# Patient Record
Sex: Male | Born: 1995 | Race: Black or African American | Hispanic: No | Marital: Single | Smoking: Current every day smoker
Health system: Southern US, Community
[De-identification: ages and names within clinical notes are randomized; demographics above are authoritative.]

## PROBLEM LIST (undated history)

## (undated) ENCOUNTER — Emergency Department (HOSPITAL_BASED_OUTPATIENT_CLINIC_OR_DEPARTMENT_OTHER): Admission: EM | Payer: Self-pay | Source: Home / Self Care

---

## 2014-10-30 ENCOUNTER — Emergency Department (HOSPITAL_COMMUNITY)
Admission: EM | Admit: 2014-10-30 | Discharge: 2014-10-31 | Disposition: A | Discharge status: Home / Self Care | Attending: Emergency Medicine | Admitting: Emergency Medicine

## 2014-10-30 ENCOUNTER — Encounter (HOSPITAL_COMMUNITY): Payer: Self-pay | Admitting: Adult Health

## 2014-10-30 ENCOUNTER — Emergency Department (HOSPITAL_COMMUNITY)

## 2014-10-30 DIAGNOSIS — B349 Viral infection, unspecified: Secondary | ICD-10-CM

## 2014-10-30 DIAGNOSIS — R079 Chest pain, unspecified: Secondary | ICD-10-CM | POA: Insufficient documentation

## 2014-10-30 DIAGNOSIS — R509 Fever, unspecified: Secondary | ICD-10-CM | POA: Diagnosis present

## 2014-10-30 LAB — I-STAT CG4 LACTIC ACID, ED: LACTIC ACID, VENOUS: 1.21 mmol/L (ref 0.5–2.0)

## 2014-10-30 LAB — CBC WITH DIFFERENTIAL/PLATELET
Basophils Absolute: 0 10*3/uL (ref 0.0–0.1)
Basophils Relative: 0 % (ref 0–1)
EOS ABS: 0 10*3/uL (ref 0.0–0.7)
Eosinophils Relative: 0 % (ref 0–5)
HCT: 44.5 % (ref 39.0–52.0)
HEMOGLOBIN: 15.5 g/dL (ref 13.0–17.0)
LYMPHS ABS: 0.5 10*3/uL — AB (ref 0.7–4.0)
LYMPHS PCT: 6 % — AB (ref 12–46)
MCH: 30 pg (ref 26.0–34.0)
MCHC: 34.8 g/dL (ref 30.0–36.0)
MCV: 86.1 fL (ref 78.0–100.0)
MONOS PCT: 10 % (ref 3–12)
Monocytes Absolute: 0.9 10*3/uL (ref 0.1–1.0)
NEUTROS PCT: 84 % — AB (ref 43–77)
Neutro Abs: 7.5 10*3/uL (ref 1.7–7.7)
PLATELETS: 157 10*3/uL (ref 150–400)
RBC: 5.17 MIL/uL (ref 4.22–5.81)
RDW: 12.6 % (ref 11.5–15.5)
WBC: 9 10*3/uL (ref 4.0–10.5)

## 2014-10-30 LAB — RAPID STREP SCREEN (MED CTR MEBANE ONLY): Streptococcus, Group A Screen (Direct): NEGATIVE

## 2014-10-30 MED ORDER — SODIUM CHLORIDE 0.9 % IV BOLUS (SEPSIS)
1000.0000 mL | Freq: Once | INTRAVENOUS | Status: AC
  Administered 2014-10-30: 1000 mL via INTRAVENOUS

## 2014-10-30 MED ORDER — KETOROLAC TROMETHAMINE 30 MG/ML IJ SOLN
30.0000 mg | Freq: Once | INTRAMUSCULAR | Status: AC
  Administered 2014-10-30: 30 mg via INTRAVENOUS
  Filled 2014-10-30: qty 1

## 2014-10-30 MED ORDER — ACETAMINOPHEN 325 MG PO TABS
650.0000 mg | ORAL_TABLET | Freq: Once | ORAL | Status: AC
  Administered 2014-10-30: 650 mg via ORAL
  Filled 2014-10-30: qty 2

## 2014-10-30 NOTE — ED Provider Notes (Signed)
CSN: [id number]     Arrival date & time 10/30/14  2054 History   First MD Initiated Contact with Patient 10/30/14 2240     Chief Complaint  Patient presents with  . Fever   HPI   Patient is 19 year old male with no past medical history who presents emergency room for evaluation of fever, chills, sore throat, and myalgias. Patient states that approximately 6:30 this afternoon he woke up from a nap and had hot and cold chills, had some mild sweating, some sore throat, nasal congestion, runny nose, and myalgias. He is also complaining of some chest pain. He denies coughing. He denies any sick contacts. He did not try any medications at home prior to coming here. Nothing seems to make him feel any worse. Patient has not had a flu shot this year. Patient is seen at [facility].  History reviewed. No pertinent past medical history. No past surgical history on file. History reviewed. No pertinent family history. History  Substance Use Topics  . Smoking status: Not on file  . Smokeless tobacco: Not on file  . Alcohol Use: Not on file    Review of Systems  Constitutional: Positive for fever, chills, diaphoresis and fatigue.  HENT: Positive for congestion, rhinorrhea and sore throat.   Respiratory: Negative for chest tightness, shortness of breath and wheezing.   Cardiovascular: Positive for chest pain. Negative for palpitations and leg swelling.  Gastrointestinal: Negative for nausea, vomiting, abdominal pain, diarrhea and constipation.  Genitourinary: Negative for dysuria, urgency, frequency, hematuria and difficulty urinating.  Musculoskeletal: Positive for myalgias. Negative for neck pain and neck stiffness.  Neurological: Negative for headaches.  All other systems reviewed and are negative.     Allergies  Review of patient's allergies indicates no known allergies.  Home Medications   Prior to Admission medications   Medication Sig Start Date End Date Taking? Authorizing  Provider  ibuprofen (ADVIL,MOTRIN) 800 MG tablet Take 1 tablet (800 mg total) by mouth 3 (three) times daily. 10/31/14   Kaleigha Chamberlin A Forcucci, PA-C  oseltamivir (TAMIFLU) 75 MG capsule Take 1 capsule (75 mg total) by mouth every 12 (twelve) hours. 10/31/14   Collie Wernick A Forcucci, PA-C   BP 122/51 mmHg  Pulse 102  Temp(Src) 100.8 F (38.2 C) (Oral)  Resp 20  SpO2 99% Physical Exam  Constitutional: He is oriented to person, place, and time. He appears well-developed and well-nourished. No distress.  HENT:  Head: Normocephalic and atraumatic.  Nose: Nose normal.  Mouth/Throat: Uvula is midline and mucous membranes are normal. No trismus in the jaw. Posterior oropharyngeal edema and posterior oropharyngeal erythema present. No oropharyngeal exudate or tonsillar abscesses.  Eyes: Conjunctivae and EOM are normal. Pupils are equal, round, and reactive to light. No scleral icterus.  Neck: Normal range of motion. Neck supple. No JVD present. No Brudzinski's sign and no Kernig's sign noted. No thyromegaly present.  Cardiovascular: Normal rate, regular rhythm, normal heart sounds and intact distal pulses.  Exam reveals no gallop and no friction rub.   No murmur heard. Pulmonary/Chest: Effort normal. No respiratory distress. He has no wheezes. He has rhonchi in the right lower field. He has no rales. He exhibits no tenderness.  Abdominal: Soft. Bowel sounds are normal. He exhibits no distension and no mass. There is no tenderness. There is no rebound and no guarding.  Musculoskeletal: Normal range of motion.  Lymphadenopathy:    He has no cervical adenopathy.  Neurological: He is alert and oriented to person, place, and  time. He has normal strength. No cranial nerve deficit or sensory deficit. Coordination normal.  Skin: Skin is warm and dry. He is not diaphoretic.  Psychiatric: He has a normal mood and affect. His behavior is normal. Judgment and thought content normal.  Nursing note and vitals  reviewed.   ED Course  Procedures (including critical care time) Labs Review Labs Reviewed  CBC WITH DIFFERENTIAL/PLATELET - Abnormal; Notable for the following:    Neutrophils Relative % 84 (*)    Lymphocytes Relative 6 (*)    Lymphs Abs 0.5 (*)    All other components within normal limits  COMPREHENSIVE METABOLIC PANEL - Abnormal; Notable for the following:    Potassium 3.4 (*)    All other components within normal limits  RAPID STREP SCREEN  CULTURE, GROUP A STREP  I-STAT CG4 LACTIC ACID, ED    Imaging Review Dg Chest 2 View  10/30/2014   CLINICAL DATA:  Fever, chills and chest pain today  EXAM: CHEST  2 VIEW  COMPARISON:  None  FINDINGS: Upper normal heart size.  Normal mediastinal contours and pulmonary vascularity.  Lungs clear.  No pleural effusion or pneumothorax.  Bones unremarkable.  IMPRESSION: No acute abnormalities.   Electronically Signed   By: Ulyses SouthwardMark  Boles M.D.   On: 10/30/2014 21:35     EKG Interpretation None      MDM   Final diagnoses:  Viral syndrome   Patient's 19 year old male who presents emergency room for evaluation of sore throat, fever, myalgias, and congestion. Physical exam does reveal some erythema of the oropharynx and no exudate. Exam is otherwise unremarkable. Rapid strep is negative. Chest x-ray is negative for acute abnormalities. CBC, CMP, and i-STAT lactic acid are currently pending. We'll start a normal saline bolus and will give an injection of Toradol. Patient already given 650 mg of Tylenol on arrival at the ED with some relief of fever. Suspect that this is likely viral upper respiratory infection. Negative Kernig and Brudzinski sign. No concerns for meningismus at this time. CBC and CMP are negative. I-STAT lactic acid is negative. Suspect that this is viral syndrome. We'll discharge patient home with ibuprofen 800 mg, and will have him alternate Tylenol and ibuprofen as needed for myalgias. I recommended drinking lots of fluids, rest, and  frequent hand washing to prevent spread of illness. We'll also discharge home with Tamiflu. Patient to return for intractable fevers, worsening shortness of breath, productive cough, or any other concerning symptoms. Family and patient state understanding and agreement at this time. Patient stable for discharge.   Eben Burowourtney A Forcucci, PA-C 10/31/14 0020  Geoffery Lyonsouglas Delo, MD 11/03/14 [phone]

## 2014-10-30 NOTE — ED Notes (Addendum)
Presents with body aches, sore throat, fever and runny nose began today around 3 pm. Temp of 101.2 denies nausea, vomting and diarrhea. VSS. Throat red. Endorses headache and body aches. Denies neck pain.

## 2014-10-31 LAB — COMPREHENSIVE METABOLIC PANEL
ALK PHOS: 49 U/L (ref 39–117)
ALT: 22 U/L (ref 0–53)
AST: 25 U/L (ref 0–37)
Albumin: 4.6 g/dL (ref 3.5–5.2)
Anion gap: 7 (ref 5–15)
BILIRUBIN TOTAL: 0.6 mg/dL (ref 0.3–1.2)
BUN: 6 mg/dL (ref 6–23)
CALCIUM: 9.5 mg/dL (ref 8.4–10.5)
CO2: 25 mmol/L (ref 19–32)
Chloride: 105 mmol/L (ref 96–112)
Creatinine, Ser: 0.98 mg/dL (ref 0.50–1.35)
GFR calc Af Amer: >90 mL/min (ref >90)
Glucose, Bld: 89 mg/dL (ref 70–99)
Potassium: 3.4 mmol/L — ABNORMAL LOW (ref 3.5–5.1)
Sodium: 137 mmol/L (ref 135–145)
TOTAL PROTEIN: 7.2 g/dL (ref 6.0–8.3)

## 2014-10-31 MED ORDER — OSELTAMIVIR PHOSPHATE 75 MG PO CAPS: 75.0000 mg | ORAL_CAPSULE | Freq: Two times a day (BID) | ORAL | Status: AC

## 2014-10-31 MED ORDER — IBUPROFEN 800 MG PO TABS: 800.0000 mg | ORAL_TABLET | Freq: Three times a day (TID) | ORAL | Status: AC

## 2014-10-31 NOTE — Discharge Instructions (Signed)
Viral Infections °A viral infection can be caused by different types of viruses. Most viral infections are not serious and resolve on their own. However, some infections may cause severe symptoms and may lead to further complications. °SYMPTOMS °Viruses can frequently cause: °· Minor sore throat. °· Aches and pains. °· Headaches. °· Runny nose. °· Different types of rashes. °· Watery eyes. °· Tiredness. °· Cough. °· Loss of appetite. °· Gastrointestinal infections, resulting in nausea, vomiting, and diarrhea. °These symptoms do not respond to antibiotics because the infection is not caused by bacteria. However, you might catch a bacterial infection following the viral infection. This is sometimes called a "superinfection." Symptoms of such a bacterial infection may include: °· Worsening sore throat with pus and difficulty swallowing. °· Swollen neck glands. °· Chills and a high or persistent fever. °· Severe headache. °· Tenderness over the sinuses. °· Persistent overall ill feeling (malaise), muscle aches, and tiredness (fatigue). °· Persistent cough. °· Yellow, green, or brown mucus production with coughing. °HOME CARE INSTRUCTIONS  °· Only take over-the-counter or prescription medicines for pain, discomfort, diarrhea, or fever as directed by your caregiver. °· Drink enough water and fluids to keep your urine clear or pale yellow. Sports drinks can provide valuable electrolytes, sugars, and hydration. °· Get plenty of rest and maintain proper nutrition. Soups and broths with crackers or rice are fine. °SEEK IMMEDIATE MEDICAL CARE IF:  °· You have severe headaches, shortness of breath, chest pain, neck pain, or an unusual rash. °· You have uncontrolled vomiting, diarrhea, or you are unable to keep down fluids. °· You or your child has an oral temperature above 102° F (38.9° C), not controlled by medicine. °· Your baby is older than 3 months with a rectal temperature of 102° F (38.9° C) or higher. °· Your baby is 3  months old or younger with a rectal temperature of 100.4° F (38° C) or higher. °MAKE SURE YOU:  °· Understand these instructions. °· Will watch your condition. °· Will get help right away if you are not doing well or get worse. °Document Released: 06/10/2005 Document Revised: 11/23/2011 Document Reviewed: 01/05/2011 °ExitCare® Patient Information ©2015 [employer]. This information is not intended to replace advice given to you by your health care provider. Make sure you discuss any questions you have with your health care provider. ° ° °Emergency Department Resource Guide °1) Find a Doctor and Pay Out of Pocket °Although you won't have to find out who is covered by your insurance plan, it is a good idea to ask around and get recommendations. You will then need to call the office and see if the doctor you have chosen will accept you as a new patient and what types of options they offer for patients who are self-pay. Some doctors offer discounts or will set up payment plans for their patients who do not have insurance, but you will need to ask so you aren't surprised when you get to your appointment. ° °2) Contact Your Local Health Department °Not all health departments have doctors that can see patients for sick visits, but many do, so it is worth a call to see if yours does. If you don't know where your local health department is, you can check in your phone book. The CDC also has a tool to help you locate your state's health department, and many state websites also have listings of all of their local health departments. ° °3) Find a Walk-in Clinic °If your illness is not likely   to be very severe or complicated, you may want to try a walk in clinic. These are popping up all over the country in pharmacies, drugstores, and shopping centers. They're usually staffed by nurse practitioners or physician assistants that have been trained to treat common illnesses and complaints. They're usually fairly quick and  inexpensive. However, if you have serious medical issues or chronic medical problems, these are probably not your best option. ° °No Primary Care Doctor: °- Call Health Connect at  832-8000 - they can help you locate a primary care doctor that  accepts your insurance, provides certain services, etc. °- Physician Referral Service- 1-800-533-3463 ° °Chronic Pain Problems: °Organization         Address  Phone   Notes  °[facility]  (336) 297-2271 Patients need to be referred by their primary care doctor.  ° °Medication Assistance: °Organization         Address  Phone   Notes  °[place] [facility] [address] °[place], [place] [address] (336) 641-8030 --Must be a resident of [place] °-- Must have NO insurance coverage whatsoever (no Medicaid/ Medicare, etc.) °-- The pt. MUST have a primary care doctor that directs their care regularly and follows them in the community °  °[facility]  (866) 331-1348   °[employer]  (888) 892-1162   ° °Agencies that provide inexpensive medical care: °Organization         Address  Phone   Notes  °[facility]  (336) 832-8035   °[facility]    (336) 832-7272   °[facility] [address] °[place], [place] [address] (336) 832-4777   °[facility] [address], °[place] (336) 271-4999   °[facility]    (336) 373-0678   °[facility]    (336) 272-1050   °[facility] ° [address], [place] Phone:  (336) 832-4444, Fax:  (336) 832-4440 Hours of Operation:  9 am - 6 pm, M-F.  Also accepts Medicaid/Medicare and self-pay.  °[facility] ° [address], [place] Phone: (336) [phone], Fax: (336) [phone]. Hours of Operation:  8:30 am - 5:30 pm, M-F.  Also accepts Medicaid and self-pay.  °[facility] [place] [address], [place] Phone: (336) 878-6027   °[facility] [address], [place], [place] (336)723-1848, Ext. 123 Mondays & Thursdays: 7-9 AM.  First 15 patients are seen on a first come, first serve basis. °  ° °Medicaid-accepting [place] Providers: ° °Organization         Address  Phone   Notes  °[facility] [address], [place] (336) 641-2100 Also accepts self-pay patients.  °[facility] [address], [place] ° (336) 856-9996   °[facility] [address], [place] (336) 288-8857   °[facility] [address] [place] [address], [place] (336) 299-7000   °Veita Bland [address], [place]  ° (336) 373-1557 Only accepts [place] Access Medicaid patients after they have their name applied to their card.  ° °Self-Pay (no insurance) in [place]: ° °Organization         Address  Phone   Notes  °Sickle Cell Patients, [facility] [address], [place] (336) 832-1970   °[facility] [address], [place] (336) 832-4400   °[facility] ° [address], [place] (336) 992-4800   °[facility]/Dr. Osei-Bonsu ° [address] [place] [address], [place] or [address], [place] (336) 841-8500 Phone number for both [place] and [place] locations is the same.  °[facility] [address], [place] (336) 299-0000   °[facility] [address] [place] [address], [place] or [address] (336) 852-7530 °(336) 878-2260   °[facility] [address], [place] (336) 350-1642, phone; (336) 294-5005, fax Sees patients 1st and 3rd Saturday of every month.  Must not qualify for public or private insurance (i.e. Medicaid, Medicare, [place] Health Choice, Veterans' Benefits) • Household income should be no more than 200% of the poverty level •The clinic cannot treat you if you are pregnant or  think you are pregnant • Sexually transmitted diseases are not treated at the clinic.  ° ° °Dental Care: °Organization         Address  Phone  Notes  °[facility] [address], [place] (336) 641-6152 Accepts children up to age 21 who are enrolled in Medicaid or [place] Health Choice; pregnant women with a Medicaid card; and children who have applied for Medicaid or [place] Health Choice, but were declined, whose parents can pay a reduced fee at time of service.  °[facility] [place]  [address], [place] (336) 641-7733 Accepts children up to age 21 who are enrolled in Medicaid or [place] Health Choice; pregnant women with a Medicaid card; and children who have applied for Medicaid or [place] Health Choice, but were declined, whose parents can pay a reduced fee at time of service.  °[facility] ° [address], [place] (336) 641-4533 Patients are seen by appointment only. Walk-ins are not accepted. [facility] will see patients 18 years of age and older. °Monday - Tuesday (8am-5pm) °Most Wednesdays (8:30-5pm) °$30 per visit, cash only  °[facility] ° [address], [place] (336) 641-4533 Patients are seen by appointment only. Walk-ins are not accepted. [facility] will see patients 18 years of age and older. °One Wednesday Evening (Monthly: Volunteer Based).  $30 per visit, cash only  °[facility]  (919) 537-3737 for adults; Children under age 4, call Graduate Pediatric Dentistry at ([phone]) [phone]. Children aged 4-14, please call (919) [phone] to request a pediatric application. ° Dental services are provided in all areas of dental care including fillings, crowns and bridges, complete and partial dentures, implants, gum treatment, root canals, and extractions. Preventive care is also provided. Treatment is provided to both adults  and children. °Patients are selected via a lottery and there is often a waiting list. °  °[facility] [address], °[place] ° (336) 763-8833 [email or web address] °  °[facility] [address], [place], [place] (336)723-1848, Ext. 123 Second and Fourth Thursday of each month, opens at 6:30 AM; Clinic ends at 9 AM.  Patients are seen on a first-come first-served basis, and a limited number are seen during each clinic.  ° °[facility] ° [address], [place], [place] (336) 723-7904   Eligibility Requirements °You must have lived in [place], [place], or [place] counties for at least the last three months. °    You cannot be eligible for state or federal sponsored healthcare insurance, including Veterans Administration, Medicaid, or Medicare. °  You generally cannot be eligible for healthcare insurance through your employer.  °  How to apply: °Eligibility screenings are held every Tuesday and Wednesday afternoon from 1:00 pm until 4:00 pm. You do not need an appointment for the interview!  °[facility] [address], [place], [place] 336-631-2330   °[facility]  336-342-8273   °[facility]  336-703-3100   °[facility]  336-570-6415   ° °Behavioral Health Resources in the Community: °Intensive Outpatient Programs °Organization         Address  Phone  Notes  °[facility] [address], [place], [place] 336-878-6098   °[facility] [address], [place], [place] 336-832-9800   °[facility] [address], [place], [place] ° 336-882-2125   °[facility] [address],  °[place], [place] 1-800-853-5163 or 336-641-4981   °Substance Abuse Resources °Organization         Address  Phone  Notes  °[facility]  336-882-2125   °[facility]  336-784-9470   °[facility]  336-285-9073     °[facility]  336-845-3988   °Residential & Outpatient Substance Abuse Program  1-800-659-3381   °Psychological Services °Organization         Address  Phone  Notes  °[facility]  336- 832-9600   °[facility]  336- 378-7881   °[facility] [address], [place] 1-800-853-5163 or 336-641-4981   ° °Mobile Crisis Teams °Organization         Address  Phone  Notes  °[facility], Mobile Crisis Care Unit  1-877-626-1772   °Assertive °[facility] ° [address] [place], [place] 336-834-9664   °Sharon DeEsch [address] °[place] 336-554-5454   ° °Self-Help/Support Groups °Organization         Address  Phone             Notes  °[facility] - variety of support groups  336- 373-1402 Call for more information  °Narcotics Anonymous (NA), [facility] [address], °[place]  2 meetings at this location  ° °Residential Treatment Programs °Organization         Address  Phone  Notes  °[facility] [address],    °[place]  1-866-801-8205   °[facility] ° [address], [place], [place] 704-293-8524   °[facility] [address], [place] 336-845-3988 Admissions: 8am-3pm M-F  °[facility] [address]    °[place], [place] 336-841-1104   °[facility] [address], [place], [place] 336-379-7146   °[facility] [address]  °[place], [place] 336-285-9073   °[facility] Intensive Outpatient [address], [place], [place] 336-852-3033   °[facility] [address]  °[place], [place] 1-877-615-2722 or 336-784-9470   °[facility] [address] [place], [place] 336-227-7417 Accepts Medicaid  °[facility] [address]  °[place] 1-800-659-3381 Substance Abuse/Addiction Treatment  ° °[place]  Behavioral Health Resources °Organization         Address  Phone  Notes  °[facility]  (888) 581-9988   °Julie Brannon, PhD [address] [place], [place]   (336) 349-5553 or (336) 951-0000   °[facility]   [address] °[place], [place] (336) 349-4454   °[facility] [address], [place], [place] (336) 342-8316 Insurance/Medicaid/sponsorship through [facility]  °[facility] [address]                                    [place], [place] (336) 342-8316 Therapy/tele-psych/case  °[facility] [address]  ° [place], [place] (336) 349-2233    °Dr. Arfeen  (336) 349-4544   °[facility] [place]  [employer] [place] [facility] 1) [address],  °2) [address], [place] °3)  [address], [place] (336) 349-3220 °(336) 342-7768 ° °(336) 342-8140   °[place] [facility] (336) 342-1394 or (336) 342-3537 (After Hours)    ° ° ° °

## 2014-11-01 LAB — CULTURE, GROUP A STREP

## 2017-06-07 ENCOUNTER — Emergency Department (HOSPITAL_COMMUNITY): Payer: Self-pay

## 2017-06-07 ENCOUNTER — Emergency Department (HOSPITAL_COMMUNITY)
Admission: EM | Admit: 2017-06-07 | Discharge: 2017-06-07 | Disposition: A | Discharge status: Home / Self Care | Payer: Self-pay | Attending: Emergency Medicine | Admitting: Emergency Medicine

## 2017-06-07 ENCOUNTER — Encounter (HOSPITAL_COMMUNITY): Payer: Self-pay

## 2017-06-07 DIAGNOSIS — F17200 Nicotine dependence, unspecified, uncomplicated: Secondary | ICD-10-CM | POA: Insufficient documentation

## 2017-06-07 DIAGNOSIS — J069 Acute upper respiratory infection, unspecified: Secondary | ICD-10-CM

## 2017-06-07 DIAGNOSIS — J399 Disease of upper respiratory tract, unspecified: Secondary | ICD-10-CM | POA: Insufficient documentation

## 2017-06-07 DIAGNOSIS — B9789 Other viral agents as the cause of diseases classified elsewhere: Secondary | ICD-10-CM

## 2017-06-07 DIAGNOSIS — R0789 Other chest pain: Secondary | ICD-10-CM | POA: Insufficient documentation

## 2017-06-07 LAB — I-STAT CHEM 8, ED
BUN: 13 mg/dL (ref 6–20)
Calcium, Ion: 1.16 mmol/L (ref 1.15–1.40)
Chloride: 103 mmol/L (ref 101–111)
Creatinine, Ser: 1.3 mg/dL — ABNORMAL HIGH (ref 0.61–1.24)
GLUCOSE: 99 mg/dL (ref 65–99)
HCT: 48 % (ref 39.0–52.0)
HEMOGLOBIN: 16.3 g/dL (ref 13.0–17.0)
POTASSIUM: 3.7 mmol/L (ref 3.5–5.1)
Sodium: 142 mmol/L (ref 135–145)
TCO2: 28 mmol/L (ref 22–32)

## 2017-06-07 LAB — I-STAT TROPONIN, ED: TROPONIN I, POC: 0.01 ng/mL (ref 0.00–0.08)

## 2017-06-07 MED ORDER — IBUPROFEN 400 MG PO TABS
600.0000 mg | ORAL_TABLET | Freq: Once | ORAL | Status: AC
  Administered 2017-06-07: 600 mg via ORAL
  Filled 2017-06-07: qty 1

## 2017-06-07 NOTE — ED Triage Notes (Signed)
Pt states he has been having sore throat for a couple of days with chest pain when he coughs; Pt states chest pain is only when he coughs; Pt states pain in throat is 6/10 on arrival. Pt states he a current every day smoker;Pt a&ox 4 on arrival.

## 2017-06-07 NOTE — ED Provider Notes (Signed)
[facility]-EMERGENCY DEPT Provider Note   CSN: [id number] Arrival date & time: 06/07/17  1909     History   Chief Complaint Chief Complaint  Patient presents with  . Sore Throat  . Chest Pain    HPI Jay Black is a 21 y.o. male.  HPI  21 year old male with no significant past medical history presents with chest pain with coughing, cough, and sore throat. He denies fevers or shortness of breath. This all started over the last 2 days. Started with sore throat first and then has had cough and pain with coughing. The pain is in his middle anterior chest. No abdominal pain, back pain, vomiting. No leg swelling or recent travel. Has not taken anything for the symptoms.  History reviewed. No pertinent past medical history.  There are no active problems to display for this patient.   History reviewed. No pertinent surgical history.     Home Medications    Prior to Admission medications   Medication Sig Start Date End Date Taking? Authorizing Provider  ibuprofen (ADVIL,MOTRIN) 800 MG tablet Take 1 tablet (800 mg total) by mouth 3 (three) times daily. 10/31/14   Forcucci, Courtney, PA-C  oseltamivir (TAMIFLU) 75 MG capsule Take 1 capsule (75 mg total) by mouth every 12 (twelve) hours. 10/31/14   Forcucci, Toni Amend, PA-C    Family History No family history on file.  Social History Social History  Substance Use Topics  . Smoking status: Current Every Day Smoker  . Smokeless tobacco: Not on file  . Alcohol use Yes     Allergies   Patient has no known allergies.   Review of Systems Review of Systems  Constitutional: Negative for fever.  HENT: Positive for sore throat.   Respiratory: Positive for cough. Negative for shortness of breath.   Cardiovascular: Positive for chest pain. Negative for leg swelling.  Neurological: Negative for headaches.  All other systems reviewed and are negative.    Physical Exam Updated Vital Signs BP (!) 152/81 (BP Location: Right Arm)    Pulse 88   Temp 98.6 F (37 C) (Oral)   Resp 16   Ht  (1.676 m)   Wt 74.6 kg (164 lb 8 oz)   SpO2 99%   BMI 26.55 kg/m   Physical Exam  Constitutional: He is oriented to person, place, and time. He appears well-developed and well-nourished.  HENT:  Head: Normocephalic and atraumatic.  Right Ear: External ear normal.  Left Ear: External ear normal.  Nose: Nose normal.  Mouth/Throat: Oropharynx is clear and moist. No oropharyngeal exudate.  Eyes: Right eye exhibits no discharge. Left eye exhibits no discharge.  Neck: Neck supple.  Cardiovascular: Normal rate, regular rhythm and normal heart sounds.   Pulmonary/Chest: Effort normal and breath sounds normal. He exhibits no tenderness.  Abdominal: Soft. There is no tenderness.  Musculoskeletal: He exhibits no edema.  Neurological: He is alert and oriented to person, place, and time.  Skin: Skin is warm and dry.  Nursing note and vitals reviewed.    ED Treatments / Results  Labs (all labs ordered are listed, but only abnormal results are displayed) Labs Reviewed  I-STAT CHEM 8, ED - Abnormal; Notable for the following:       Result Value   Creatinine, Ser 1.30 (*)    All other components within normal limits  I-STAT TROPONIN, ED    EKG  EKG Interpretation  Date/Time:  Monday June 07 2017 19:47:18 EDT Ventricular Rate:  98 PR Interval:  132  QRS Duration: 86 QT Interval:  332 QTC Calculation: 423 R Axis:   93 Text Interpretation:  Normal sinus rhythm Rightward axis T wave abnormality, consider inferior ischemia Abnormal ECG No old tracing to compare Confirmed by Pricilla Loveless [phone]) on 06/07/2017 7:56:38 PM       Radiology Dg Chest 2 View  Result Date: 06/07/2017 CLINICAL DATA:  Sore throat for several days EXAM: CHEST  2 VIEW COMPARISON:  None. FINDINGS: The heart size and mediastinal contours are within normal limits. Both lungs are clear. The visualized skeletal structures are unremarkable.  IMPRESSION: No active cardiopulmonary disease. Electronically Signed   By: Tollie Eth M.D.   On: 06/07/2017 20:36    Procedures Procedures (including critical care time)  Medications Ordered in ED Medications  ibuprofen (ADVIL,MOTRIN) tablet 600 mg (600 mg Oral Given 06/07/17 2032)     Initial Impression / Assessment and Plan / ED Course  I have reviewed the triage vital signs and the nursing notes.  Pertinent labs & imaging results that were available during my care of the patient were reviewed by me and considered in my medical decision making (see chart for details).     Likely has mild viral URI. While he does have chest pain with some nonspecific T-wave changes this is very likely to be chest wall in etiology. Given it is coming along with cough and sore throat I think viral URI is the most likely cause. He did have a troponin sent by triage which was negative. Given symptoms for over 48 hours at think ACS is highly unlikely. Highly doubt PE or dissection. Creatinine slightly increased from last check 2 years ago, will need to increase fluid intake but does not appear consistent with renal failure. Discussed return precautions.  Final Clinical Impressions(s) / ED Diagnoses   Final diagnoses:  Viral upper respiratory tract infection with cough  Chest wall pain    New Prescriptions Discharge Medication List as of 06/07/2017  8:43 PM       Pricilla Loveless, MD 06/07/17 2057

## 2017-11-09 ENCOUNTER — Emergency Department (HOSPITAL_COMMUNITY)
Admission: EM | Admit: 2017-11-09 | Discharge: 2017-11-09 | Disposition: A | Discharge status: Home / Self Care | Payer: Self-pay | Attending: Emergency Medicine | Admitting: Emergency Medicine

## 2017-11-09 ENCOUNTER — Other Ambulatory Visit: Payer: Self-pay

## 2017-11-09 ENCOUNTER — Encounter (HOSPITAL_COMMUNITY): Payer: Self-pay | Admitting: *Deleted

## 2017-11-09 DIAGNOSIS — Z79899 Other long term (current) drug therapy: Secondary | ICD-10-CM | POA: Insufficient documentation

## 2017-11-09 DIAGNOSIS — J069 Acute upper respiratory infection, unspecified: Secondary | ICD-10-CM | POA: Insufficient documentation

## 2017-11-09 DIAGNOSIS — F17200 Nicotine dependence, unspecified, uncomplicated: Secondary | ICD-10-CM | POA: Insufficient documentation

## 2017-11-09 MED ORDER — BENZONATATE 100 MG PO CAPS: 200.0000 mg | ORAL_CAPSULE | Freq: Two times a day (BID) | ORAL | 0 refills | Status: AC | PRN

## 2017-11-09 MED ORDER — CETIRIZINE HCL 10 MG PO TABS: 10.0000 mg | ORAL_TABLET | Freq: Every day | ORAL | 1 refills | Status: AC

## 2017-11-09 MED ORDER — ALBUTEROL SULFATE (2.5 MG/3ML) 0.083% IN NEBU
5.0000 mg | INHALATION_SOLUTION | Freq: Once | RESPIRATORY_TRACT | Status: AC
  Administered 2017-11-09: 5 mg via RESPIRATORY_TRACT
  Filled 2017-11-09: qty 6

## 2017-11-09 NOTE — ED Provider Notes (Signed)
[facility] EMERGENCY DEPARTMENT Provider Note   CSN: [id number] Arrival date & time: 11/09/17  0219     History   Chief Complaint Chief Complaint  Patient presents with  . Nasal Congestion    HPI Jay Black is a 22 y.o. male.  Patient presents to the emergency department with chief complaint of nasal congestion.  He denies any fevers or chills.  Denies nausea or vomiting.  Denies productive cough.  There are no other associated symptoms.  There are no aggravating factors.  There are no alleviating factors.   The history is provided by the patient. No language interpreter was used.    History reviewed. No pertinent past medical history.  There are no active problems to display for this patient.   History reviewed. No pertinent surgical history.     Home Medications    Prior to Admission medications   Medication Sig Start Date End Date Taking? Authorizing Provider  benzonatate (TESSALON) 100 MG capsule Take 2 capsules (200 mg total) by mouth 2 (two) times daily as needed for cough. 11/09/17   Roxy Horseman, PA-C  cetirizine (ZYRTEC ALLERGY) 10 MG tablet Take 1 tablet (10 mg total) by mouth daily. 11/09/17   Roxy Horseman, PA-C  ibuprofen (ADVIL,MOTRIN) 800 MG tablet Take 1 tablet (800 mg total) by mouth 3 (three) times daily. 10/31/14   Forcucci, Courtney, PA-C  oseltamivir (TAMIFLU) 75 MG capsule Take 1 capsule (75 mg total) by mouth every 12 (twelve) hours. 10/31/14   Forcucci, Toni Amend, PA-C    Family History No family history on file.  Social History Social History   Tobacco Use  . Smoking status: Current Every Day Smoker  . Smokeless tobacco: Never Used  Substance Use Topics  . Alcohol use: Yes  . Drug use: No     Allergies   Patient has no known allergies.   Review of Systems Review of Systems  All other systems reviewed and are negative.    Physical Exam Updated Vital Signs BP (!) 162/79 (BP Location: Right Arm)    Pulse 94   Temp 99.4 F (37.4 C) (Oral)   Resp 20   SpO2 100%   Physical Exam Physical Exam  Constitutional: Pt  is oriented to person, place, and time. Appears well-developed and well-nourished. No distress.  HENT:  Head: Normocephalic and atraumatic.  Right Ear: Tympanic membrane, external ear and ear canal normal.  Left Ear: Tympanic membrane, external ear and ear canal normal.  Nose: Mucosal edema and mild rhinorrhea present. No epistaxis. Right sinus exhibits no maxillary sinus tenderness and no frontal sinus tenderness. Left sinus exhibits no maxillary sinus tenderness and no frontal sinus tenderness.  Mouth/Throat: Uvula is midline and mucous membranes are normal. Mucous membranes are not pale and not cyanotic. No oropharyngeal exudate, posterior oropharyngeal edema, posterior oropharyngeal erythema or tonsillar abscesses.  Eyes: Conjunctivae are normal. Pupils are equal, round, and reactive to light.  Neck: Normal range of motion and full passive range of motion without pain.  Cardiovascular: Normal rate and intact distal pulses.   Pulmonary/Chest: Effort normal and breath sounds normal. No stridor.  Clear and equal breath sounds without focal wheezes, rhonchi, rales  Abdominal: Soft. Bowel sounds are normal. There is no tenderness.  Musculoskeletal: Normal range of motion.  Lymphadenopathy:    Pthas no cervical adenopathy.  Neurological: Pt is alert and oriented to person, place, and time.  Skin: Skin is warm and dry. No rash noted. Pt is not diaphoretic.  Psychiatric:  Normal mood and affect.  Nursing note and vitals reviewed.    ED Treatments / Results  Labs (all labs ordered are listed, but only abnormal results are displayed) Labs Reviewed - No data to display  EKG  EKG Interpretation None       Radiology No results found.  Procedures Procedures (including critical care time)  Medications Ordered in ED Medications  albuterol (PROVENTIL) (2.5 MG/3ML)  0.083% nebulizer solution 5 mg (5 mg Nebulization Given 11/09/17 0238)     Initial Impression / Assessment and Plan / ED Course  I have reviewed the triage vital signs and the nursing notes.  Pertinent labs & imaging results that were available during my care of the patient were reviewed by me and considered in my medical decision making (see chart for details).     Pt CXR negative for acute infiltrate. Patients symptoms are consistent with URI, likely viral etiology. Discussed that antibiotics are not indicated for viral infections. Pt will be discharged with symptomatic treatment.  Verbalizes understanding and is agreeable with plan. Pt is hemodynamically stable & in NAD prior to dc.   Final Clinical Impressions(s) / ED Diagnoses   Final diagnoses:  Upper respiratory tract infection, unspecified type    ED Discharge Orders        Ordered    cetirizine (ZYRTEC ALLERGY) 10 MG tablet  Daily     11/09/17 0538    benzonatate (TESSALON) 100 MG capsule  2 times daily PRN     11/09/17 0538       Roxy HorsemanBrowning, Jame Morrell, PA-C 11/09/17 0540    Glynn Octaveancour, Stephen, MD 11/09/17 [phone]

## 2017-11-09 NOTE — ED Triage Notes (Signed)
Pt has been feeling sick since last night; reports having increased dry cough tonight with difficulty catching his breath and chest tightness

## 2018-09-04 IMAGING — DX DG CHEST 2V
2 series · 2 of 2 positions shown · non-contrast
Comparison: None.

CLINICAL DATA: Sore throat for several days

EXAM:
CHEST  2 VIEW

[chest pa]
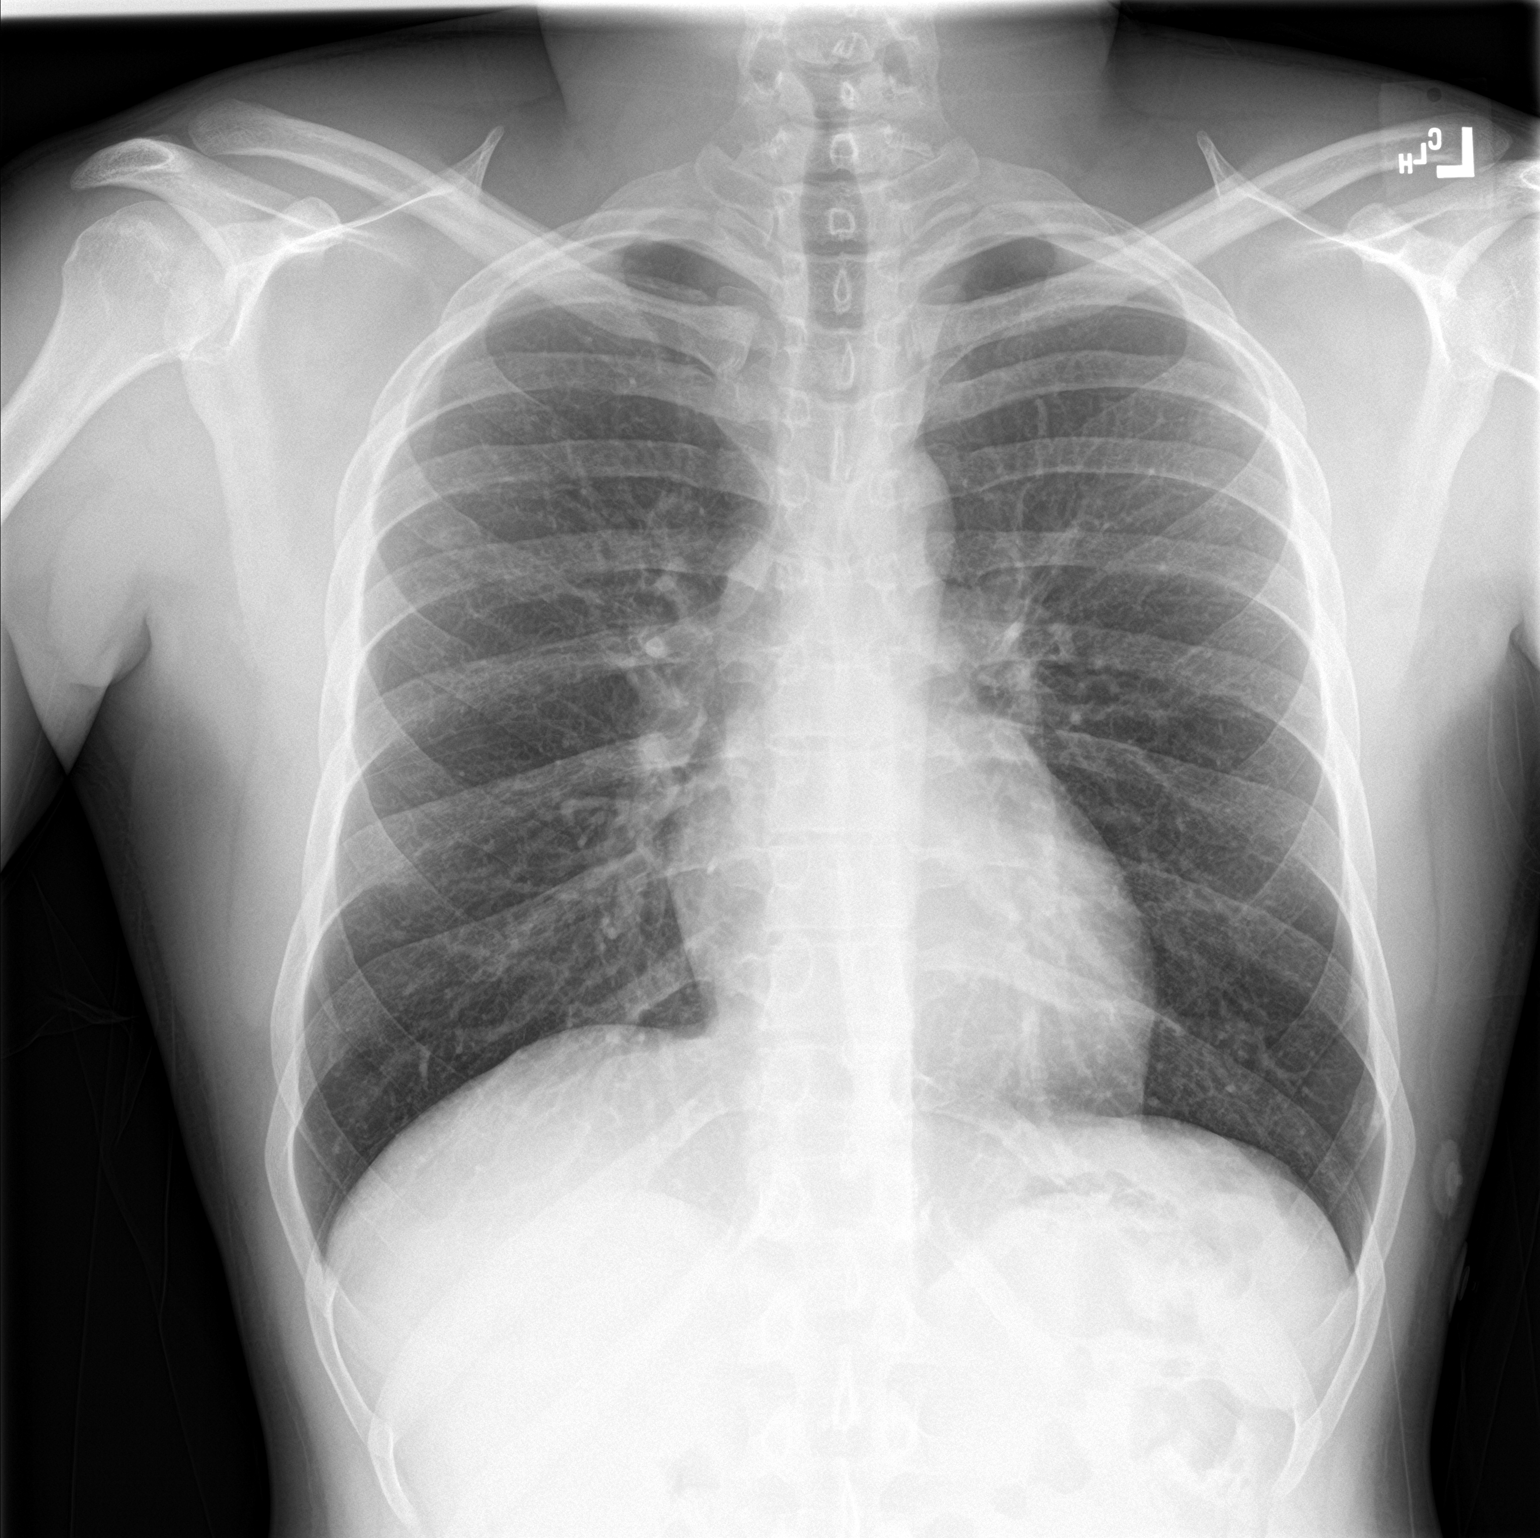

[chest lat]
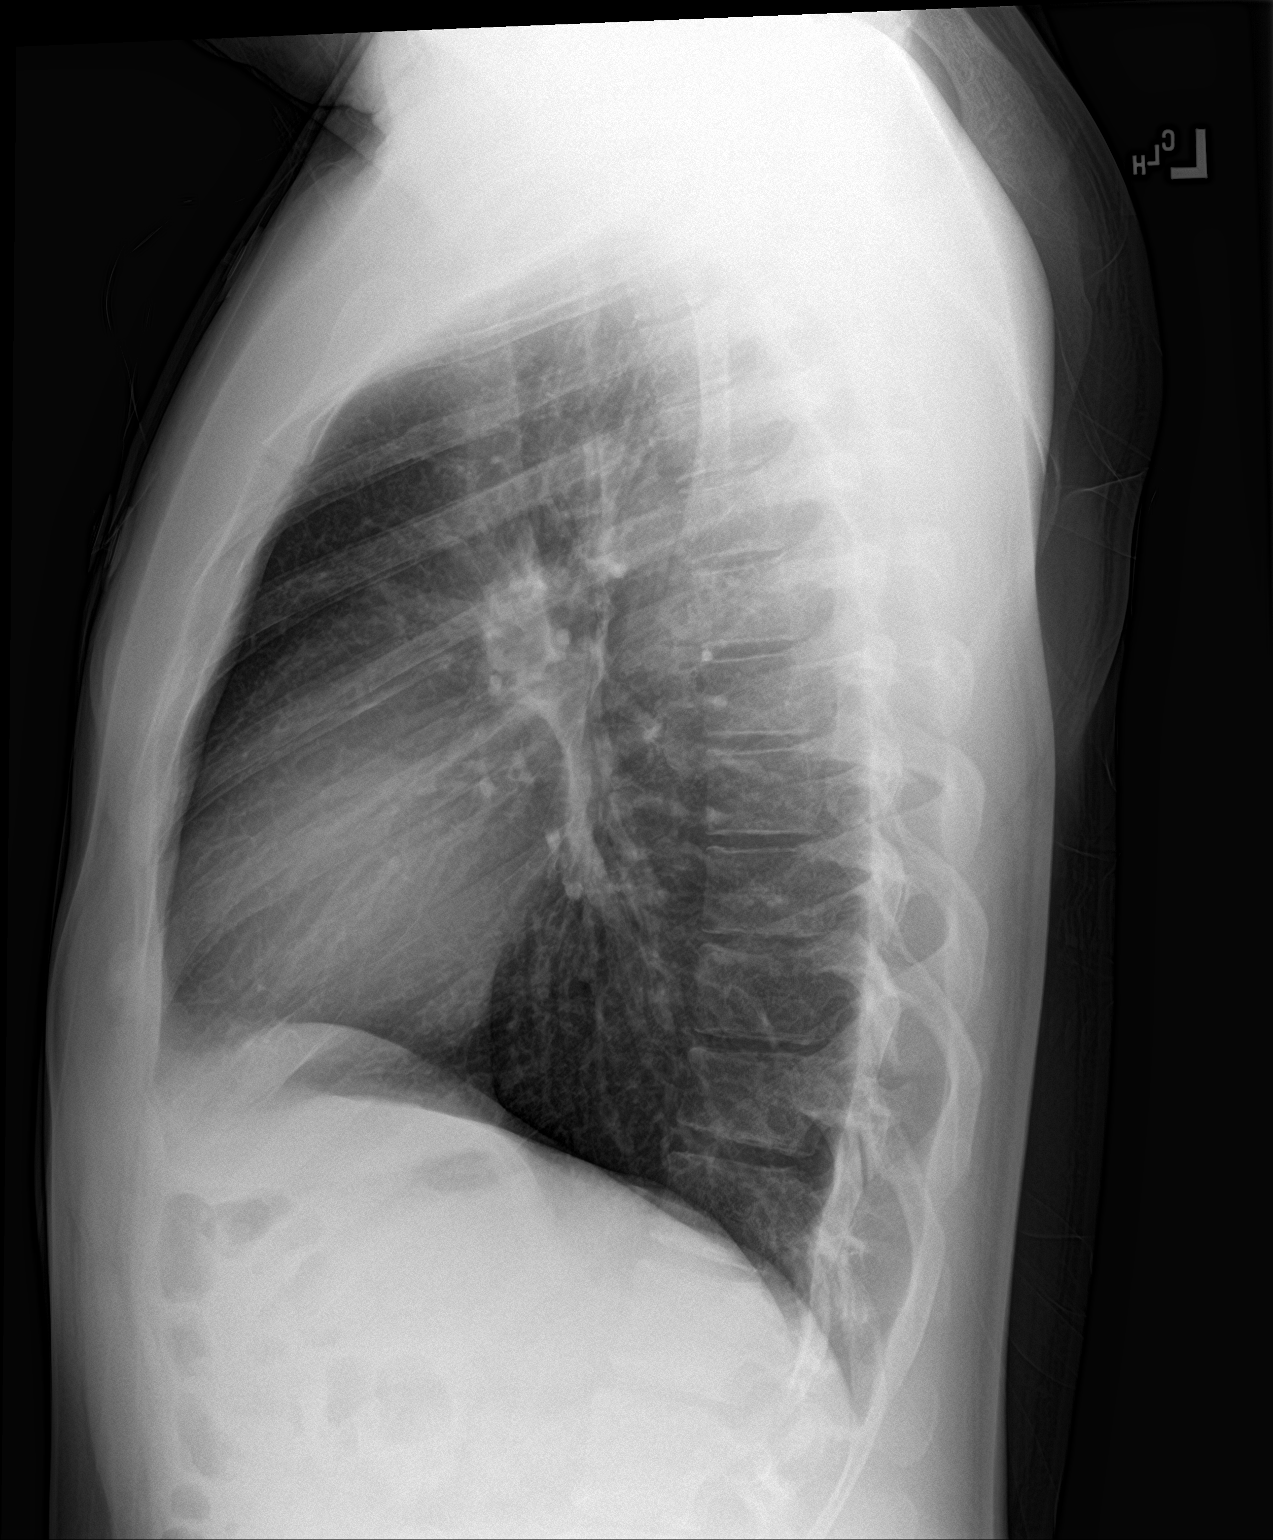

[2 of 2 positions shown; findings below may reference images not displayed]

FINDINGS: The heart size and mediastinal contours are within normal limits.
Both lungs are clear. The visualized skeletal structures are
unremarkable.
IMPRESSION: No active cardiopulmonary disease.

## 2023-09-04 ENCOUNTER — Emergency Department (HOSPITAL_BASED_OUTPATIENT_CLINIC_OR_DEPARTMENT_OTHER)
Admission: EM | Admit: 2023-09-04 | Discharge: 2023-09-05 | Disposition: A | Discharge status: Home / Self Care | Payer: No Typology Code available for payment source | Attending: Emergency Medicine | Admitting: Emergency Medicine

## 2023-09-04 ENCOUNTER — Emergency Department (HOSPITAL_BASED_OUTPATIENT_CLINIC_OR_DEPARTMENT_OTHER): Payer: No Typology Code available for payment source | Admitting: Radiology

## 2023-09-04 ENCOUNTER — Encounter (HOSPITAL_BASED_OUTPATIENT_CLINIC_OR_DEPARTMENT_OTHER): Payer: Self-pay

## 2023-09-04 ENCOUNTER — Other Ambulatory Visit: Payer: Self-pay

## 2023-09-04 DIAGNOSIS — R112 Nausea with vomiting, unspecified: Secondary | ICD-10-CM

## 2023-09-04 DIAGNOSIS — Z1152 Encounter for screening for COVID-19: Secondary | ICD-10-CM | POA: Diagnosis not present

## 2023-09-04 DIAGNOSIS — B349 Viral infection, unspecified: Secondary | ICD-10-CM | POA: Diagnosis not present

## 2023-09-04 DIAGNOSIS — R059 Cough, unspecified: Secondary | ICD-10-CM | POA: Diagnosis present

## 2023-09-04 LAB — COMPREHENSIVE METABOLIC PANEL
ALT: 20 U/L (ref 0–44)
AST: 19 U/L (ref 15–41)
Albumin: 5 g/dL (ref 3.5–5.0)
Alkaline Phosphatase: 46 U/L (ref 38–126)
Anion gap: 10 (ref 5–15)
BUN: 13 mg/dL (ref 6–20)
CO2: 27 mmol/L (ref 22–32)
Calcium: 10.1 mg/dL (ref 8.9–10.3)
Chloride: 103 mmol/L (ref 98–111)
Creatinine, Ser: 1.04 mg/dL (ref 0.61–1.24)
GFR, Estimated: >60 mL/min (ref >60)
Glucose, Bld: 104 mg/dL — ABNORMAL HIGH (ref 70–99)
Potassium: 3.7 mmol/L (ref 3.5–5.1)
Sodium: 140 mmol/L (ref 135–145)
Total Bilirubin: 0.7 mg/dL (ref <1.2)
Total Protein: 7.8 g/dL (ref 6.5–8.1)

## 2023-09-04 LAB — RESP PANEL BY RT-PCR (RSV, FLU A&B, COVID)  RVPGX2
Influenza A by PCR: NEGATIVE
Influenza B by PCR: NEGATIVE
Resp Syncytial Virus by PCR: NEGATIVE
SARS Coronavirus 2 by RT PCR: NEGATIVE

## 2023-09-04 LAB — CBC
HCT: 48.7 % (ref 39.0–52.0)
Hemoglobin: 16.2 g/dL (ref 13.0–17.0)
MCH: 29.4 pg (ref 26.0–34.0)
MCHC: 33.3 g/dL (ref 30.0–36.0)
MCV: 88.4 fL (ref 80.0–100.0)
Platelets: 207 10*3/uL (ref 150–400)
RBC: 5.51 MIL/uL (ref 4.22–5.81)
RDW: 12.6 % (ref 11.5–15.5)
WBC: 9.9 10*3/uL (ref 4.0–10.5)
nRBC: 0 % (ref 0.0–0.2)

## 2023-09-04 LAB — LIPASE, BLOOD: Lipase: 18 U/L (ref 11–51)

## 2023-09-04 MED ORDER — PANTOPRAZOLE SODIUM 40 MG IV SOLR
40.0000 mg | Freq: Once | INTRAVENOUS | Status: AC
  Administered 2023-09-04: 40 mg via INTRAVENOUS
  Filled 2023-09-04: qty 10

## 2023-09-04 MED ORDER — LACTATED RINGERS IV BOLUS
1000.0000 mL | Freq: Once | INTRAVENOUS | Status: AC
  Administered 2023-09-04: 1000 mL via INTRAVENOUS

## 2023-09-04 MED ORDER — ONDANSETRON HCL 4 MG/2ML IJ SOLN
4.0000 mg | Freq: Once | INTRAMUSCULAR | Status: AC
  Administered 2023-09-04: 4 mg via INTRAVENOUS
  Filled 2023-09-04: qty 2

## 2023-09-04 MED ORDER — ACETAMINOPHEN 500 MG PO TABS
1000.0000 mg | ORAL_TABLET | Freq: Once | ORAL | Status: AC
  Administered 2023-09-04: 1000 mg via ORAL
  Filled 2023-09-04: qty 2

## 2023-09-04 NOTE — ED Provider Notes (Signed)
[place] EMERGENCY DEPARTMENT AT [facility] Provider Note   CSN: [id number] Arrival date & time: 09/04/23  2102     History {Add pertinent medical, surgical, social history, OB history to HPI:1} Chief Complaint  Patient presents with   Chest Pain   Emesis    Sacario Reetz is a 27 y.o. male.  Patient states he has been coughing for the past 3 weeks productive of yellow mucus.  Over the past 24 hours he developed nausea, vomiting and chest pain.  States he was vomiting several times a day nonbloody and nonbilious and describes yellow emesis.  Developed chest pain about 6 PM after vomiting which is since resolved.  Chest pain lasted several hours.  Not having any chest pain now but is having epigastric pain.  On arrival he is tachycardic and febrile.  Has had multiple sick contacts at home.  1 episode of diarrhea.  About 4-5 episodes of vomiting.  Complains of upper abdominal pain.  No history of ulcers or acid reflux.  No history of cardiac issues.  No further chest pain.  No shortness of breath.  Febrile to 102.4 on arrival.  Still has appendix and gallbladder. No pain with urination or blood in the urine.  The history is provided by the patient.  Chest Pain Associated symptoms: abdominal pain, cough, fatigue, fever, nausea, shortness of breath, vomiting and weakness   Associated symptoms: no headache   Emesis Associated symptoms: abdominal pain, arthralgias, cough, diarrhea, fever and myalgias   Associated symptoms: no headaches and no sore throat        Home Medications Prior to Admission medications   Medication Sig Start Date End Date Taking? Authorizing Provider  benzonatate (TESSALON) 100 MG capsule Take 2 capsules (200 mg total) by mouth 2 (two) times daily as needed for cough. 11/09/17   Roxy Horseman, PA-C  cetirizine (ZYRTEC ALLERGY) 10 MG tablet Take 1 tablet (10 mg total) by mouth daily. 11/09/17   Roxy Horseman, PA-C  ibuprofen (ADVIL,MOTRIN) 800 MG  tablet Take 1 tablet (800 mg total) by mouth 3 (three) times daily. 10/31/14   Shirleen Schirmer, PA-C  oseltamivir (TAMIFLU) 75 MG capsule Take 1 capsule (75 mg total) by mouth every 12 (twelve) hours. 10/31/14   Shirleen Schirmer, PA-C      Allergies    Patient has no known allergies.    Review of Systems   Review of Systems  Constitutional:  Positive for activity change, appetite change, fatigue and fever.  HENT:  Negative for congestion, nosebleeds, sinus pain and sore throat.   Respiratory:  Positive for cough, chest tightness and shortness of breath.   Cardiovascular:  Positive for chest pain.  Gastrointestinal:  Positive for abdominal pain, diarrhea, nausea and vomiting.  Genitourinary:  Negative for dysuria and hematuria.  Musculoskeletal:  Positive for arthralgias and myalgias.  Skin:  Negative for rash.  Neurological:  Positive for weakness. Negative for headaches.   all other systems are negative except as noted in the HPI and PMH.    Physical Exam Updated Vital Signs BP 118/72 (BP Location: Left Arm)   Pulse 85   Temp 100 F (37.8 C) (Oral)   Resp 18   SpO2 95%  Physical Exam Vitals and nursing note reviewed.  Constitutional:      General: He is not in acute distress.    Appearance: He is well-developed.     Comments: Febrile  HENT:     Head: Normocephalic and atraumatic.     Mouth/Throat:  Pharynx: No oropharyngeal exudate.  Eyes:     Conjunctiva/sclera: Conjunctivae normal.     Pupils: Pupils are equal, round, and reactive to light.  Neck:     Comments: No meningismus. Cardiovascular:     Rate and Rhythm: Normal rate and regular rhythm.     Heart sounds: Normal heart sounds. No murmur heard. Pulmonary:     Effort: Pulmonary effort is normal. No respiratory distress.     Breath sounds: Normal breath sounds.     Comments: No hypoxia or increased work of breathing Abdominal:     Palpations: Abdomen is soft.     Tenderness: There is abdominal tenderness.  There is no guarding or rebound.     Comments: Periumbilical and epigastric tenderness, no guarding or rebound  Musculoskeletal:        General: No tenderness. Normal range of motion.     Cervical back: Normal range of motion and neck supple.  Skin:    General: Skin is warm.  Neurological:     Mental Status: He is alert and oriented to person, place, and time.     Cranial Nerves: No cranial nerve deficit.     Motor: No abnormal muscle tone.     Coordination: Coordination normal.     Comments:  5/5 strength throughout. CN 2-12 intact.Equal grip strength.   Psychiatric:        Behavior: Behavior normal.     ED Results / Procedures / Treatments   Labs (all labs ordered are listed, but only abnormal results are displayed) Labs Reviewed  COMPREHENSIVE METABOLIC PANEL - Abnormal; Notable for the following components:      Result Value   Glucose, Bld 104 (*)    All other components within normal limits  RESP PANEL BY RT-PCR (RSV, FLU A&B, COVID)  RVPGX2  LIPASE, BLOOD  CBC  URINALYSIS, ROUTINE W REFLEX MICROSCOPIC  TROPONIN I (HIGH SENSITIVITY)    EKG EKG Interpretation Date/Time:  Saturday September 04 2023 21:24:08 EST Ventricular Rate:  117 PR Interval:  130 QRS Duration:  84 QT Interval:  300 QTC Calculation: 418 R Axis:   93  Text Interpretation: Sinus tachycardia Rightward axis Nonspecific T wave abnormality Abnormal ECG When compared with ECG of 07-Jun-2017 19:47, T wave inversion less evident in Inferior leads T wave amplitude has decreased in Lateral leads Rate faster Confirmed by Glynn Octave [phone]) on 09/04/2023 11:28:24 PM  Radiology No results found.  Procedures Procedures  {Document cardiac monitor, telemetry assessment procedure when appropriate:1}  Medications Ordered in ED Medications  pantoprazole (PROTONIX) injection 40 mg (has no administration in time range)  lactated ringers bolus 1,000 mL (has no administration in time range)  ondansetron  (ZOFRAN) injection 4 mg (has no administration in time range)  acetaminophen (TYLENOL) tablet 1,000 mg (1,000 mg Oral Given 09/04/23 2129)    ED Course/ Medical Decision Making/ A&P   {   Click here for ABCD2, HEART and other calculatorsREFRESH Note before signing :1}                              Medical Decision Making Amount and/or Complexity of Data Reviewed Labs: ordered. Decision-making details documented in ED Course. Radiology: ordered and independent interpretation performed. Decision-making details documented in ED Course. ECG/medicine tests: ordered and independent interpretation performed. Decision-making details documented in ED Course.  Risk OTC drugs. Prescription drug management.   3 weeks of cough with 1 day of nausea and  vomiting and abdominal pain.  Did have some chest pain after vomiting which is since resolved.  EKG without acute ischemia.  Tachycardic and febrile on arrival.  Will hydrate, check labs, COVID and flu.  {Document critical care time when appropriate:1} {Document review of labs and clinical decision tools ie heart score, Chads2Vasc2 etc:1}  {Document your independent review of radiology images, and any outside records:1} {Document your discussion with family members, caretakers, and with consultants:1} {Document social determinants of health affecting pt's care:1} {Document your decision making why or why not admission, treatments were needed:1} Final Clinical Impression(s) / ED Diagnoses Final diagnoses:  None    Rx / DC Orders ED Discharge Orders     None

## 2023-09-04 NOTE — ED Notes (Signed)
Pt denies CP at this time/ pt continues to have N/V

## 2023-09-04 NOTE — ED Triage Notes (Addendum)
Pt c/o [place], CP "bout all day, the chest pain comes after the vomiting." Abd pain, cough x2wks. States son was sick recently

## 2023-09-05 ENCOUNTER — Emergency Department (HOSPITAL_BASED_OUTPATIENT_CLINIC_OR_DEPARTMENT_OTHER): Payer: No Typology Code available for payment source | Admitting: Radiology

## 2023-09-05 ENCOUNTER — Emergency Department (HOSPITAL_BASED_OUTPATIENT_CLINIC_OR_DEPARTMENT_OTHER): Payer: No Typology Code available for payment source

## 2023-09-05 LAB — URINALYSIS, ROUTINE W REFLEX MICROSCOPIC
Bilirubin Urine: NEGATIVE
Glucose, UA: NEGATIVE mg/dL
Hgb urine dipstick: NEGATIVE
Ketones, ur: 15 mg/dL — AB
Leukocytes,Ua: NEGATIVE
Nitrite: NEGATIVE
Protein, ur: 30 mg/dL — AB
Specific Gravity, Urine: 1.04 — ABNORMAL HIGH (ref 1.005–1.030)
pH: 7.5 (ref 5.0–8.0)

## 2023-09-05 LAB — TROPONIN I (HIGH SENSITIVITY)
Troponin I (High Sensitivity): 2 ng/L (ref <18)
Troponin I (High Sensitivity): 2 ng/L (ref <18)

## 2023-09-05 MED ORDER — FAMOTIDINE IN NACL 20-0.9 MG/50ML-% IV SOLN
20.0000 mg | Freq: Once | INTRAVENOUS | Status: AC
  Administered 2023-09-05: 20 mg via INTRAVENOUS
  Filled 2023-09-05: qty 50

## 2023-09-05 MED ORDER — LACTATED RINGERS IV BOLUS
1000.0000 mL | Freq: Once | INTRAVENOUS | Status: AC
  Administered 2023-09-05: 1000 mL via INTRAVENOUS

## 2023-09-05 MED ORDER — IOHEXOL 300 MG/ML  SOLN
100.0000 mL | Freq: Once | INTRAMUSCULAR | Status: AC | PRN
  Administered 2023-09-05: 100 mL via INTRAVENOUS

## 2023-09-05 MED ORDER — FAMOTIDINE 20 MG PO TABS: 20.0000 mg | ORAL_TABLET | Freq: Every day | ORAL | 0 refills | Status: AC

## 2023-09-05 MED ORDER — LIDOCAINE VISCOUS HCL 2 % MT SOLN
15.0000 mL | Freq: Once | OROMUCOSAL | Status: AC
Start: 2023-09-05 — End: 2023-09-05
  Administered 2023-09-05: 15 mL via OROMUCOSAL
  Filled 2023-09-05: qty 15

## 2023-09-05 MED ORDER — ONDANSETRON 4 MG PO TBDP: 4.0000 mg | ORAL_TABLET | Freq: Three times a day (TID) | ORAL | 0 refills | Status: AC | PRN

## 2023-09-05 NOTE — ED Notes (Signed)
Pt given of sprite per pt request for PO challenge

## 2023-09-05 NOTE — Discharge Instructions (Addendum)
Your testing is reassuring.  No evidence of UTI, pneumonia, COVID or flu.  As we discussed you likely have a viral syndrome.  You do have inflamed lymph nodes in your abdomen which may cause pain.  No evidence of appendicitis or other acute surgical pathology.  Start with a clear liquid diet and advance slowly as tolerated.  Follow-up with your doctor this week.  Return to the ED with difficulty breathing, not able to eat or drink, intractable nausea and vomiting, intractable pain or other concerns.
